# Patient Record
Sex: Female | Born: 1995 | Race: Black or African American | Hispanic: No | Marital: Single | Smoking: Never smoker
Health system: Southern US, Community
[De-identification: ages and names within clinical notes are randomized; demographics above are authoritative.]

---

## 2018-08-11 ENCOUNTER — Ambulatory Visit (HOSPITAL_COMMUNITY)
Admission: EM | Admit: 2018-08-11 | Discharge: 2018-08-11 | Disposition: A | Discharge status: Home / Self Care | Payer: BC Managed Care – PPO | Source: Home / Self Care

## 2018-08-11 ENCOUNTER — Emergency Department (HOSPITAL_COMMUNITY)
Admission: EM | Admit: 2018-08-11 | Discharge: 2018-08-12 | Disposition: A | Discharge status: Home / Self Care | Payer: BC Managed Care – PPO | Attending: Emergency Medicine | Admitting: Emergency Medicine

## 2018-08-11 ENCOUNTER — Encounter (HOSPITAL_COMMUNITY): Payer: Self-pay

## 2018-08-11 DIAGNOSIS — G44319 Acute post-traumatic headache, not intractable: Secondary | ICD-10-CM

## 2018-08-11 DIAGNOSIS — G44309 Post-traumatic headache, unspecified, not intractable: Secondary | ICD-10-CM | POA: Diagnosis not present

## 2018-08-11 DIAGNOSIS — R51 Headache: Secondary | ICD-10-CM | POA: Diagnosis present

## 2018-08-11 LAB — I-STAT BETA HCG BLOOD, ED (MC, WL, AP ONLY)

## 2018-08-11 LAB — BASIC METABOLIC PANEL
Anion gap: 8 (ref 5–15)
BUN: 9 mg/dL (ref 6–20)
CO2: 24 mmol/L (ref 22–32)
Calcium: 9 mg/dL (ref 8.9–10.3)
Chloride: 105 mmol/L (ref 98–111)
Creatinine, Ser: 0.59 mg/dL (ref 0.44–1.00)
Glucose, Bld: 86 mg/dL (ref 70–99)
Potassium: 4 mmol/L (ref 3.5–5.1)
SODIUM: 137 mmol/L (ref 135–145)

## 2018-08-11 LAB — CBC
HCT: 39.4 % (ref 36.0–46.0)
Hemoglobin: 12.1 g/dL (ref 12.0–15.0)
MCH: 27.9 pg (ref 26.0–34.0)
MCHC: 30.7 g/dL (ref 30.0–36.0)
MCV: 91 fL (ref 80.0–100.0)
PLATELETS: 211 10*3/uL (ref 150–400)
RBC: 4.33 MIL/uL (ref 3.87–5.11)
RDW: 12.2 % (ref 11.5–15.5)
WBC: 6.2 10*3/uL (ref 4.0–10.5)
nRBC: 0 % (ref 0.0–0.2)

## 2018-08-11 NOTE — ED Notes (Addendum)
Patient had a syncopal episode and fell.  Patient alert and oriented x 5.  Dr Delton See and Langston Masker, pa are agreeable to patient going to ed.  Patient is agreeable

## 2018-08-11 NOTE — ED Provider Notes (Signed)
[facility] EMERGENCY DEPARTMENT Provider Note   CSN: [id number] Arrival date & time: 08/11/18  1757   History   Chief Complaint Chief Complaint  Patient presents with  . Headache  . Loss of Consciousness    HPI Tina Henry is a 23 y.o. female.  HPI  Tina Henry is a 23 y.o. female with PMH of none who presents from urgent care with persistent headache after patient had syncopal event on Sunday.  Patient reports that she was sitting in her boyfriend's car in the front seat and had the heat on because it was cold outside.  She had not had much to eat or drink throughout the day and they just picked up food.  Open the door and stood up to walk inside and then rapidly felt her vision tunnel and then she saw spots, her hearing slowly went out, and then she lost consciousness.  Fell back and hit her head.  Thinks that she lost consciousness for about 1 to 2 seconds.  Was disoriented briefly after waking up to return to her baseline at about 30 seconds to a minute.  Able to ambulate without difficulty.  She has had a mild achy headache in her right posterior head that radiates around the right parietal scalp to the frontal head.  It has been nearly constant although does improve with ibuprofen at home.  Intermittently sharp.  No photophobia or phonophobia.  No vertigo and no recurrent lightheadedness.  She had no chest pain or palpitations and no dyspnea.  Never syncopized before.  No abdominal pain.  Eating and drinking normally.  Went to urgent care and they referred her to the ED.  No difficulty walking and denies numbness/weakness/tingling and paresthesias.  History reviewed. No pertinent past medical history.  There are no active problems to display for this patient.   History reviewed. No pertinent surgical history.   OB History   No obstetric history on file.      Home Medications    Prior to Admission medications   Not on File    Family History History  reviewed. No pertinent family history.  Social History Social History   Tobacco Use  . Smoking status: Never Smoker  . Smokeless tobacco: Never Used  Substance Use Topics  . Alcohol use: Yes    Comment: socially  . Drug use: Not on file     Allergies   Patient has no known allergies.   Review of Systems Review of Systems  Constitutional: Negative for chills and fever.  HENT: Negative for ear pain and sore throat.   Eyes: Negative for pain and visual disturbance.  Respiratory: Negative for cough and shortness of breath.   Cardiovascular: Negative for palpitations.  Gastrointestinal: Negative for abdominal pain and vomiting.  Genitourinary: Negative for dysuria and hematuria.  Musculoskeletal: Negative for arthralgias and back pain.  Skin: Negative for color change and rash.  Neurological: Positive for syncope and headaches. Negative for dizziness, seizures, facial asymmetry, weakness and light-headedness.  All other systems reviewed and are negative.    Physical Exam Updated Vital Signs BP 109/68   Pulse 80   Temp 98.5 F (36.9 C) (Oral)   Resp 18   SpO2 100%   Physical Exam Vitals signs and nursing note reviewed.  Constitutional:      General: She is not in acute distress.    Appearance: Normal appearance. She is well-developed. She is not ill-appearing.  HENT:     Head: Normocephalic and atraumatic.  No raccoon eyes, Battle's sign, abrasion, contusion, right periorbital erythema, left periorbital erythema or laceration.     Jaw: There is normal jaw occlusion.     Right Ear: Hearing, tympanic membrane, ear canal and external ear normal. No hemotympanum.     Left Ear: Hearing, tympanic membrane, ear canal and external ear normal. No hemotympanum.     Nose: Nose normal.     Mouth/Throat:     Lips: Pink.     Mouth: Mucous membranes are moist.  Eyes:     Conjunctiva/sclera: Conjunctivae normal.  Neck:     Musculoskeletal: Full passive range of motion without  pain and neck supple.  Cardiovascular:     Rate and Rhythm: Normal rate and regular rhythm.     Heart sounds: No murmur.  Pulmonary:     Effort: Pulmonary effort is normal. No respiratory distress.     Breath sounds: Normal breath sounds.  Abdominal:     Palpations: Abdomen is soft.     Tenderness: There is no abdominal tenderness.  Skin:    General: Skin is warm and dry.  Neurological:     General: No focal deficit present.     Mental Status: She is alert and oriented to person, place, and time.     GCS: GCS eye subscore is 4. GCS verbal subscore is 5. GCS motor subscore is 6.     Cranial Nerves: Cranial nerves are intact.     Sensory: Sensation is intact.     Motor: Motor function is intact.     Coordination: Coordination is intact.     Gait: Gait is intact.  Psychiatric:        Behavior: Behavior is cooperative.      ED Treatments / Results  Labs (all labs ordered are listed, but only abnormal results are displayed) Labs Reviewed  BASIC METABOLIC PANEL  CBC  I-STAT BETA HCG BLOOD, ED ([facility], [facility], [facility] ONLY)    EKG None  Radiology No results found.  Procedures Procedures (including critical care time)  Medications Ordered in ED Medications - No data to display   Initial Impression / Assessment and Plan / ED Course  I have reviewed the triage vital signs and the nursing notes.  Pertinent labs & imaging results that were available during my care of the patient were reviewed by me and considered in my medical decision making (see chart for details).     MDM:  Imaging: None indicated at this time.    ED Provider Interpretation of EKG: Sinus rhythm mother of 78 bpm, normal axis, minimal ST segment elevation most consistent with early repolarization.  No depression, pathologic T wave changes or significant interval regularity.  No bundle-branch blocks.  No delta wave.  No evidence for Brugada syndrome.  Labs: CBC and BMP normal.  Beta hCG negative.  On initial  evaluation, patient appears well. Afebrile and hemodynamically stable. Alert and oriented x4, pleasant, and cooperative.  Presents after syncope 5 days ago with headache as detailed above.  On exam, patient has no focal abnormality.  Her head and neck exam is normal.  No evidence for basilar skull fracture.  No tenderness to palpation about the area that she subjectively has the worst pain.  No palpable skull fracture or hematoma.  Full range of motion of her neck without pain.  No neurologic deficit and pulses are equal bilaterally.  Able to ambulate without difficulty.  No dysmetria or ataxia.  Patient is [place] CT head rule negative.  [place] CT C-spine negative.  No indication for imaging at this time.  Patient was offered analgesia and declined.  EKG shows evidence for acute ischemia or arrhythmia.  Her symptoms are consistent with vasovagal type syncope and she had no chest pain or palpitations.  No indication for troponin at this time.  Patient was counseled extensively on her signs and symptoms and reassuring exam, however, she was referred to the ED for possible CT head.  Showed patient the [place] CT head rule and discussed her symptoms.  Offered CT scan if she would like to undergo CT head but did discuss risks and benefits of small amount of radiation exposure.  In shared decision making, patient declined CT head.  Feel that this is appropriate.    Counseled on follow-up with her PCP as well as sports medicine with information on postconcussive syndrome symptoms.  Counseled that she may use Tylenol and Motrin as needed for pain per over-the-counter dosing as she had significant improvement with that prior to hospital arrival.  Discharged in stable condition with PCP follow-up and return precautions.  The plan for this patient was discussed with Dr. Rhunette Croft who voiced agreement and who oversaw evaluation and treatment of this patient.   The patient was fully informed and involved with the  history taking, evaluation, workup including labs/images, and plan. The patient's concerns and questions were addressed to the patient's satisfaction and she expressed agreement with the plan to DC home.    Final Clinical Impressions(s) / ED Diagnoses   Final diagnoses:  Acute post-traumatic headache, not intractable  Post-concussion headache    ED Discharge Orders    None       Remi Lopata, Sherryle Lis, MD 08/11/18 [id number]    Derwood Kaplan, MD 08/12/18 1601

## 2018-08-11 NOTE — ED Triage Notes (Signed)
Pt arrives POV for eval of headache after syncopal episode on Sunday and striking head. Was not seen for syncope, states she hadnt eaten all day and the car was very warm. Pt reports persistent HA ongoing today and she "just wants to be checked to be sure"

## 2020-05-24 ENCOUNTER — Encounter (HOSPITAL_COMMUNITY): Payer: Self-pay | Admitting: Emergency Medicine

## 2020-05-24 ENCOUNTER — Other Ambulatory Visit: Payer: Self-pay

## 2020-05-24 ENCOUNTER — Emergency Department (HOSPITAL_COMMUNITY)
Admission: EM | Admit: 2020-05-24 | Discharge: 2020-05-24 | Disposition: A | Discharge status: Home / Self Care | Payer: BC Managed Care – PPO | Attending: Emergency Medicine | Admitting: Emergency Medicine

## 2020-05-24 ENCOUNTER — Emergency Department (HOSPITAL_COMMUNITY): Payer: BC Managed Care – PPO

## 2020-05-24 DIAGNOSIS — N730 Acute parametritis and pelvic cellulitis: Secondary | ICD-10-CM

## 2020-05-24 DIAGNOSIS — N739 Female pelvic inflammatory disease, unspecified: Secondary | ICD-10-CM | POA: Diagnosis not present

## 2020-05-24 DIAGNOSIS — R1032 Left lower quadrant pain: Secondary | ICD-10-CM | POA: Diagnosis present

## 2020-05-24 LAB — URINALYSIS, ROUTINE W REFLEX MICROSCOPIC
Bilirubin Urine: NEGATIVE
Glucose, UA: NEGATIVE mg/dL
Hgb urine dipstick: NEGATIVE
Ketones, ur: 20 mg/dL — AB
Leukocytes,Ua: NEGATIVE
Nitrite: NEGATIVE
Protein, ur: NEGATIVE mg/dL
Specific Gravity, Urine: 1.028 (ref 1.005–1.030)
pH: 5 (ref 5.0–8.0)

## 2020-05-24 LAB — I-STAT BETA HCG BLOOD, ED (MC, WL, AP ONLY): I-stat hCG, quantitative: <5 m[IU]/mL (ref <5)

## 2020-05-24 LAB — COMPREHENSIVE METABOLIC PANEL
ALT: 15 U/L (ref 0–44)
AST: 18 U/L (ref 15–41)
Albumin: 4.3 g/dL (ref 3.5–5.0)
Alkaline Phosphatase: 49 U/L (ref 38–126)
Anion gap: 10 (ref 5–15)
BUN: 9 mg/dL (ref 6–20)
CO2: 27 mmol/L (ref 22–32)
Calcium: 9.2 mg/dL (ref 8.9–10.3)
Chloride: 98 mmol/L (ref 98–111)
Creatinine, Ser: 0.6 mg/dL (ref 0.44–1.00)
GFR, Estimated: >60 mL/min (ref >60)
Glucose, Bld: 108 mg/dL — ABNORMAL HIGH (ref 70–99)
Potassium: 3.3 mmol/L — ABNORMAL LOW (ref 3.5–5.1)
Sodium: 135 mmol/L (ref 135–145)
Total Bilirubin: 0.3 mg/dL (ref 0.3–1.2)
Total Protein: 8.3 g/dL — ABNORMAL HIGH (ref 6.5–8.1)

## 2020-05-24 LAB — WET PREP, GENITAL
Sperm: NONE SEEN
Trich, Wet Prep: NONE SEEN
Yeast Wet Prep HPF POC: NONE SEEN

## 2020-05-24 LAB — CBC
HCT: 40.6 % (ref 36.0–46.0)
Hemoglobin: 13.1 g/dL (ref 12.0–15.0)
MCH: 29.4 pg (ref 26.0–34.0)
MCHC: 32.3 g/dL (ref 30.0–36.0)
MCV: 91 fL (ref 80.0–100.0)
Platelets: 265 10*3/uL (ref 150–400)
RBC: 4.46 MIL/uL (ref 3.87–5.11)
RDW: 12.5 % (ref 11.5–15.5)
WBC: 12.1 10*3/uL — ABNORMAL HIGH (ref 4.0–10.5)
nRBC: 0 % (ref 0.0–0.2)

## 2020-05-24 LAB — LIPASE, BLOOD: Lipase: 21 U/L (ref 11–51)

## 2020-05-24 MED ORDER — LIDOCAINE HCL 1 % IJ SOLN
INTRAMUSCULAR | Status: AC
  Administered 2020-05-24: 2.1 mL
  Filled 2020-05-24: qty 20

## 2020-05-24 MED ORDER — DOXYCYCLINE HYCLATE 100 MG PO CAPS: 100.0000 mg | ORAL_CAPSULE | Freq: Two times a day (BID) | ORAL | 0 refills | Status: AC

## 2020-05-24 MED ORDER — IOHEXOL 300 MG/ML  SOLN
100.0000 mL | Freq: Once | INTRAMUSCULAR | Status: AC | PRN
  Administered 2020-05-24: 80 mL via INTRAVENOUS

## 2020-05-24 MED ORDER — LIDOCAINE HCL (PF) 1 % IJ SOLN: 1.0000 mL | Freq: Once | INTRAMUSCULAR | Status: DC

## 2020-05-24 MED ORDER — CEFTRIAXONE SODIUM 1 G IJ SOLR
500.0000 mg | Freq: Once | INTRAMUSCULAR | Status: AC
  Administered 2020-05-24: 500 mg via INTRAMUSCULAR
  Filled 2020-05-24: qty 10

## 2020-05-24 MED ORDER — METRONIDAZOLE 500 MG PO TABS
2000.0000 mg | ORAL_TABLET | Freq: Once | ORAL | Status: AC
  Administered 2020-05-24: 2000 mg via ORAL
  Filled 2020-05-24: qty 4

## 2020-05-24 MED ORDER — LIDOCAINE HCL (PF) 1 % IJ SOLN: 2.0000 mL | Freq: Once | INTRAMUSCULAR | Status: DC

## 2020-05-24 MED ORDER — AZITHROMYCIN 250 MG PO TABS
1000.0000 mg | ORAL_TABLET | Freq: Once | ORAL | Status: AC
  Administered 2020-05-24: 1000 mg via ORAL
  Filled 2020-05-24: qty 4

## 2020-05-24 NOTE — Discharge Instructions (Addendum)
Clinically suspicious for pelvic infection known as pelvic inflammatory disease based on the fluid in the fallopian tubes on CT scan.  Take the antibiotic doxycycline as directed for the next 14 days.  Would expect improvement over the next 2 days.  He also received antibiotic Rocephin and Zithromax and Flagyl here tonight.  Return for any new or worse symptoms.  Also notify your sexual partner of concerns for pelvic infection which can be sexually transmitted disease.

## 2020-05-24 NOTE — ED Triage Notes (Signed)
Patient reports abdominal pain x1 week, endorses bloating and tightness. Denies N/V/D, SOB, CP. Denies changes in discharge, urination, or back pain.

## 2020-05-24 NOTE — ED Provider Notes (Signed)
[facility]-EMERGENCY DEPT Provider Note   CSN: [id number] Arrival date & time: 05/24/20  1411     History No chief complaint on file.   Tina Henry is a 24 y.o. female.  Patient with a complaint of lower quadrant abdominal pain more to the left for a week.  Some bloating and tightness.  Denies any nausea vomiting or diarrhea any fevers denies any shortness of breath chest pain denies any vaginal discharge urination or back pain.        History reviewed. No pertinent past medical history.  There are no problems to display for this patient.   History reviewed. No pertinent surgical history.   OB History   No obstetric history on file.     No family history on file.  Social History   Tobacco Use  . Smoking status: Never Smoker  . Smokeless tobacco: Never Used  Substance Use Topics  . Alcohol use: Yes    Comment: socially  . Drug use: Not on file    Home Medications Prior to Admission medications   Medication Sig Start Date End Date Taking? Authorizing Provider  VIENVA 0.1-20 MG-MCG tablet Take 1 tablet by mouth daily. 02/19/20  Yes [provider]  doxycycline (VIBRAMYCIN) 100 MG capsule Take 1 capsule (100 mg total) by mouth 2 (two) times daily for 14 days. 05/24/20 06/07/20  Vanetta Mulders, MD    Allergies    Patient has no known allergies.  Review of Systems   Review of Systems  Constitutional: Negative for chills and fever.  HENT: Negative for congestion, rhinorrhea and sore throat.   Eyes: Negative for visual disturbance.  Respiratory: Negative for cough and shortness of breath.   Cardiovascular: Negative for chest pain and leg swelling.  Gastrointestinal: Positive for abdominal pain. Negative for diarrhea, nausea and vomiting.  Genitourinary: Negative for dysuria.  Musculoskeletal: Negative for back pain and neck pain.  Skin: Negative for rash.  Neurological: Negative for dizziness, light-headedness and headaches.    Hematological: Does not bruise/bleed easily.  Psychiatric/Behavioral: Negative for confusion.    Physical Exam Updated Vital Signs BP (!) 122/91   Pulse 78   Temp 98.8 F (37.1 C) (Oral)   Resp 16   Ht 1.626 m (5\' 4" )   Wt 45.4 kg   LMP 05/03/2020   SpO2 100%   BMI 17.16 kg/m   Physical Exam Vitals and nursing note reviewed.  Constitutional:      General: She is not in acute distress.    Appearance: Normal appearance. She is well-developed.  HENT:     Head: Normocephalic and atraumatic.  Eyes:     Extraocular Movements: Extraocular movements intact.     Conjunctiva/sclera: Conjunctivae normal.     Pupils: Pupils are equal, round, and reactive to light.  Cardiovascular:     Rate and Rhythm: Normal rate and regular rhythm.     Heart sounds: No murmur heard.   Pulmonary:     Effort: Pulmonary effort is normal. No respiratory distress.     Breath sounds: Normal breath sounds.  Abdominal:     Palpations: Abdomen is soft.     Tenderness: There is abdominal tenderness.     Comments: Mild tenderness to palpation left lower quadrant.  No guarding.  Genitourinary:    General: Normal vulva.     Comments: Pelvic exam external genitalia normal.  There is a purulent vaginal discharge seems to be coming from the cervical os.  No significant cervical motion tenderness.  But some slight tenderness.  No uterine tenderness and some mild left adnexal tenderness. Musculoskeletal:     Cervical back: Normal range of motion and neck supple.  Skin:    General: Skin is warm and dry.     Capillary Refill: Capillary refill takes less than 2 seconds.  Neurological:     General: No focal deficit present.     Mental Status: She is alert and oriented to person, place, and time.     Cranial Nerves: No cranial nerve deficit.     Sensory: No sensory deficit.     Motor: No weakness.     ED Results / Procedures / Treatments   Labs (all labs ordered are listed, but only abnormal results are  displayed) Labs Reviewed  WET PREP, GENITAL - Abnormal; Notable for the following components:      Result Value   Clue Cells Wet Prep HPF POC PRESENT (*)    WBC, Wet Prep HPF POC MODERATE (*)    All other components within normal limits  COMPREHENSIVE METABOLIC PANEL - Abnormal; Notable for the following components:   Potassium 3.3 (*)    Glucose, Bld 108 (*)    Total Protein 8.3 (*)    All other components within normal limits  CBC - Abnormal; Notable for the following components:   WBC 12.1 (*)    All other components within normal limits  URINALYSIS, ROUTINE W REFLEX MICROSCOPIC - Abnormal; Notable for the following components:   Ketones, ur 20 (*)    All other components within normal limits  LIPASE, BLOOD  RPR  HIV ANTIBODY (ROUTINE TESTING W REFLEX)  I-STAT BETA HCG BLOOD, ED ([facility], [facility], [facility] ONLY)  GC/CHLAMYDIA PROBE AMP ([place]) NOT AT [facility]    EKG None  Radiology CT Abdomen Pelvis W Contrast  Result Date: 05/24/2020 CLINICAL DATA:  Left lower quadrant abdominal pain EXAM: CT ABDOMEN AND PELVIS WITH CONTRAST TECHNIQUE: Multidetector CT imaging of the abdomen and pelvis was performed using the standard protocol following bolus administration of intravenous contrast. CONTRAST:  44mL OMNIPAQUE IOHEXOL 300 MG/ML  SOLN COMPARISON:  None. FINDINGS: Lower chest: The visualized lung bases are clear. The visualized heart and pericardium are unremarkable. Hepatobiliary: 8 mm cyst within the right hepatic lobe. Probable tiny cyst within the medial left hepatic lobe. Liver otherwise unremarkable. No intra or extrahepatic biliary ductal dilation. Gallbladder unremarkable Pancreas: Unremarkable Spleen: Unremarkable Adrenals/Urinary Tract: Adrenal glands are unremarkable. Kidneys are normal, without renal calculi, focal lesion, or hydronephrosis. Bladder is unremarkable. Stomach/Bowel: The stomach, small bowel, and large bowel are unremarkable. Appendix normal. There is moderate high  attenuation fluid within the pelvis likely representing hemorrhagic or proteinaceous fluid. No free intraperitoneal gas. Vascular/Lymphatic: No significant vascular findings are present. No enlarged abdominal or pelvic lymph nodes. Reproductive: The uterus is unremarkable. A 2.7 cm benign appearing cystic lesion is seen within the left ovary likely representing a follicular cyst. There is hyperemia involving the adnexa tissues with suggestion of varicoid fluid-filled fallopian tubes, best appreciated on sagittal imaging. While nonspecific, these findings can be seen in the setting of pelvic inflammatory disease with associated bilateral hydrosalpinx or pyosalpinx. Other: Rectum unremarkable no lytic or blastic bone lesion is identified. A a 8 mm ovoid lesion is seen in the region of the urethra possibly representing a a Skene's duct cyst (periurethral duct cyst), less likely a urethral diverticulum. Musculoskeletal: No lytic or blastic bone lesion identified. IMPRESSION: Hyperemia and varicoid fluid-filled structures within the adnexa bilaterally suggesting bilateral  hydrosalpinx, possibly the setting of pelvic inflammatory disease. Moderate high attenuation free fluid is seen within the pelvis compatible with hemorrhagic or proteinaceous fluid. Correlation with clinical examination and possible dedicated transvaginal sonography may be helpful for further evaluation. Ovoid lesion in the region of the urethra in near the external urethral meatus most likely representing a Skene's duct cyst. Electronically Signed   By: Helyn Numbers MD   On: 05/24/2020 21:26    Procedures Procedures (including critical care time)  Medications Ordered in ED Medications  azithromycin (ZITHROMAX) tablet 1,000 mg (has no administration in time range)  cefTRIAXone (ROCEPHIN) injection 500 mg (has no administration in time range)  metroNIDAZOLE (FLAGYL) tablet 2,000 mg (has no administration in time range)  iohexol (OMNIPAQUE)  300 MG/ML solution 100 mL (80 mLs Intravenous Contrast Given 05/24/20 2059)    ED Course  I have reviewed the triage vital signs and the nursing notes.  Pertinent labs & imaging results that were available during my care of the patient were reviewed by me and considered in my medical decision making (see chart for details).    MDM Rules/Calculators/A&P                          Patient nontoxic no acute distress.  On pelvic exam purulent discharge certainly consistent with STD.  But no real significant cervical motion tenderness or uterine tenderness just some mild left adnexal tenderness.  CT scan raises concerns about bilateral fallopian tube fluid which would be suggestive of PID.  Patient's pregnancy test negative mild leukocytosis.  No other acute findings on the CT of the abdomen.  Based on the significant purulent vaginal discharge coming from the cervical os will treat as STD and PID.  Patient will return if no improvement over the next few days. Final Clinical Impression(s) / ED Diagnoses Final diagnoses:  PID (acute pelvic inflammatory disease)    Rx / DC Orders ED Discharge Orders         Ordered    doxycycline (VIBRAMYCIN) 100 MG capsule  2 times daily        05/24/20 2313           Vanetta Mulders, MD 05/24/20 2318

## 2020-05-24 NOTE — ED Notes (Signed)
D/c no concerns at this time. 

## 2020-05-25 LAB — HIV ANTIBODY (ROUTINE TESTING W REFLEX): HIV Screen 4th Generation wRfx: NONREACTIVE

## 2020-05-25 LAB — RPR: RPR Ser Ql: NONREACTIVE

## 2020-05-27 LAB — GC/CHLAMYDIA PROBE AMP (~~LOC~~) NOT AT ARMC
Chlamydia: POSITIVE — AB
Comment: NEGATIVE
Comment: NORMAL
Neisseria Gonorrhea: NEGATIVE

## 2021-06-30 IMAGING — CT CT ABD-PELV W/ CM
2 of 4 series · 14 of 46 positions shown, 16 images · IV contrast (omnipaque)
Comparison: None.

CLINICAL DATA: Left lower quadrant abdominal pain

EXAM:
CT ABDOMEN AND PELVIS WITH CONTRAST
TECHNIQUE: Multidetector CT imaging of the abdomen and pelvis was performed
using the standard protocol following bolus administration of
intravenous contrast.
CONTRAST:  80mL OMNIPAQUE IOHEXOL 300 MG/ML  SOLN

[Series 2: axial st · axial · 0.57mm/px · z∈[+1101,+1471]mm · 11 of 84 slices shown, 13 images]
[im 5/84  soft-tissue]
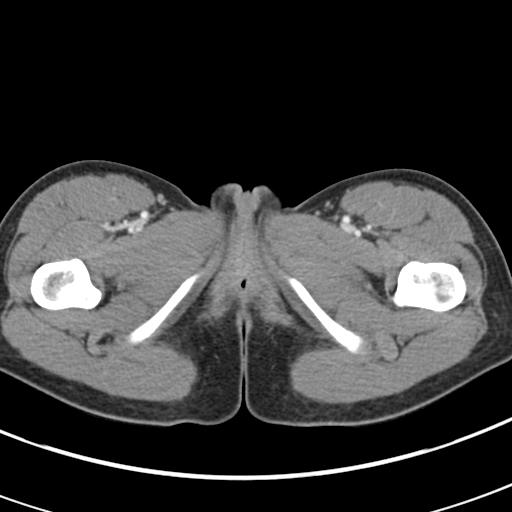
[im 5/84  bone]
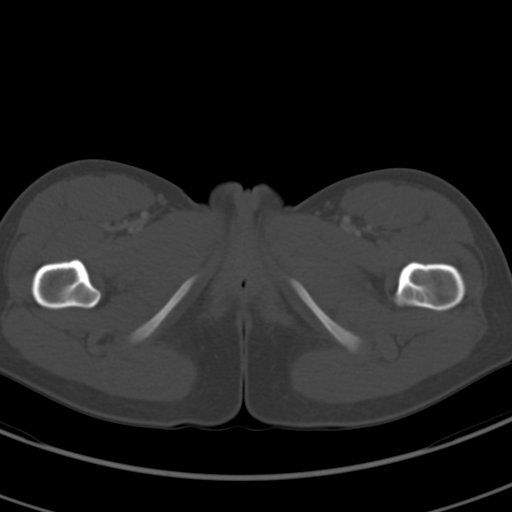
[im 13/84  soft-tissue]
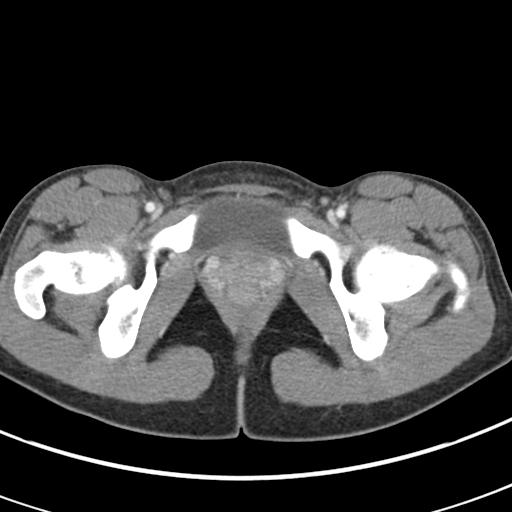
[im 21/84  soft-tissue]
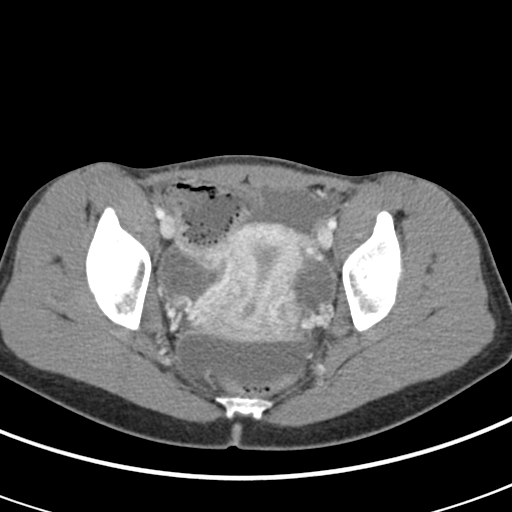
[im 30/84  soft-tissue]
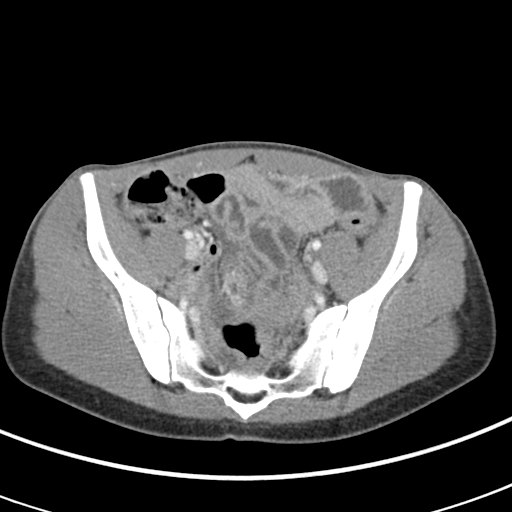
[im 34/84  soft-tissue]
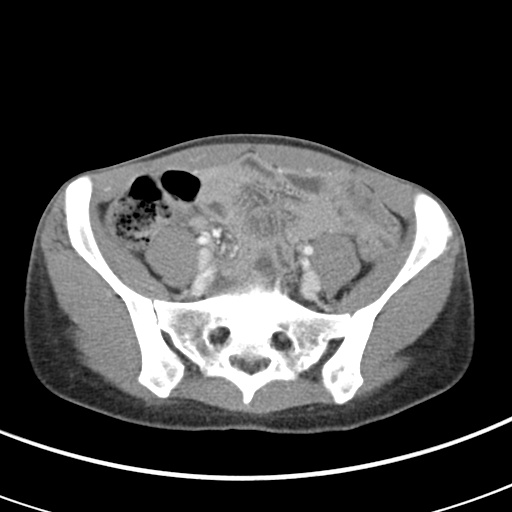
[im 42/84  soft-tissue]
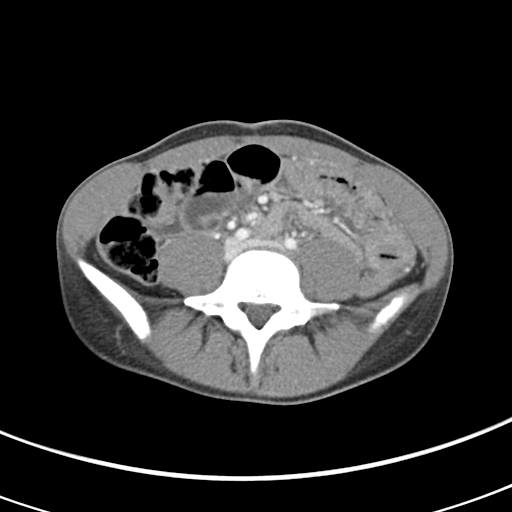
[im 50/84  soft-tissue]
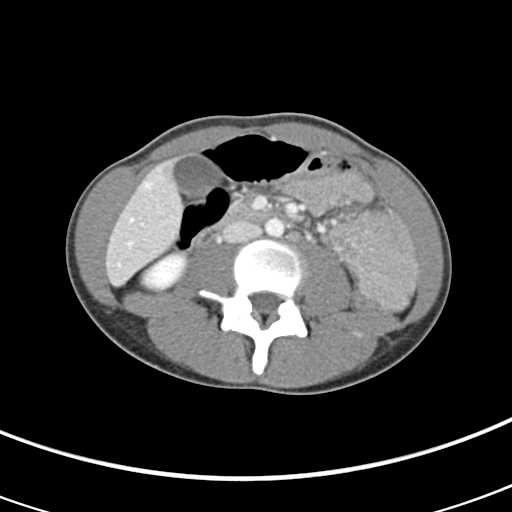
[im 54/84  soft-tissue]
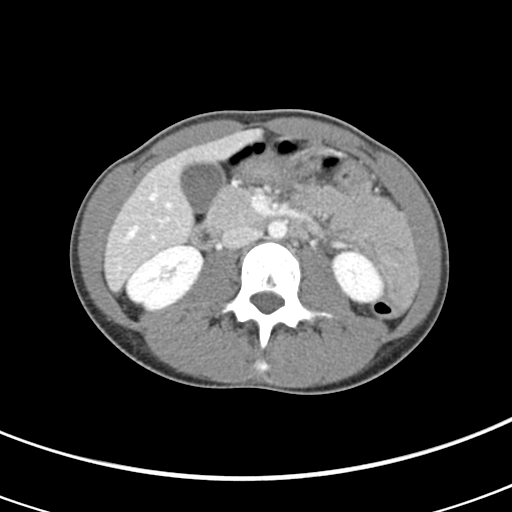
[im 63/84  soft-tissue]
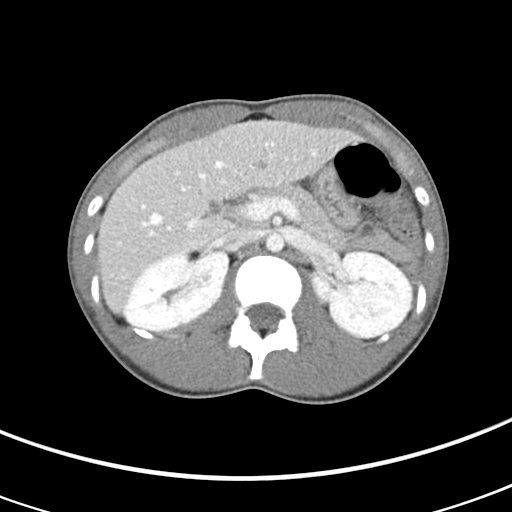
[im 63/84  bone]
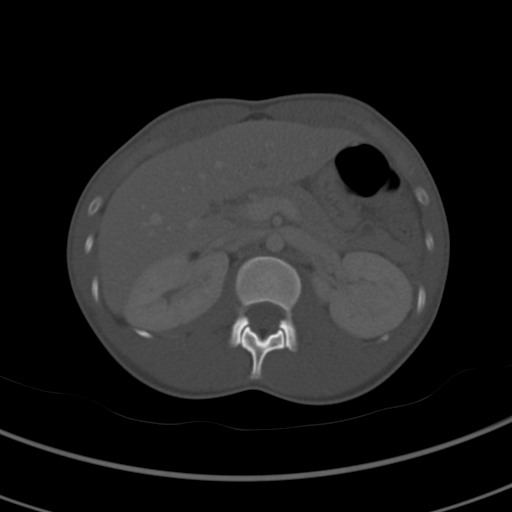
[im 71/84  soft-tissue]
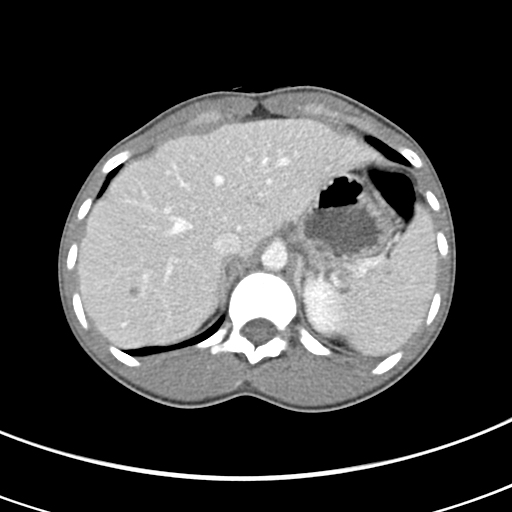
[im 79/84  soft-tissue]
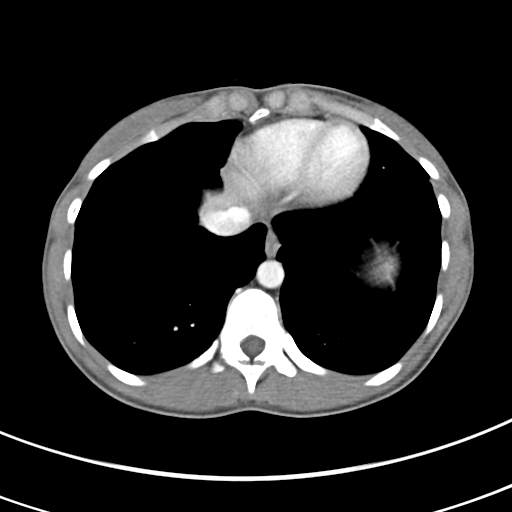

[Series 5: coronal st · coronal · 0.53mm/px · 3 of 107 slices shown]
[im 36/107  soft-tissue]
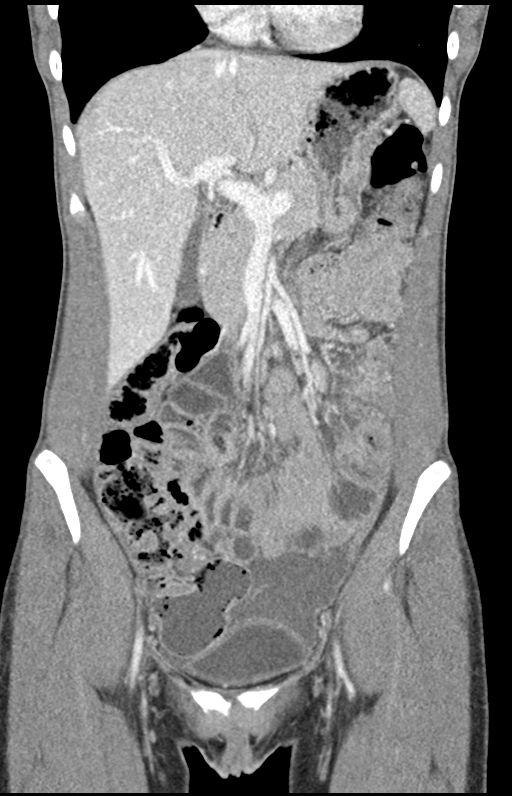
[im 48/107  soft-tissue]
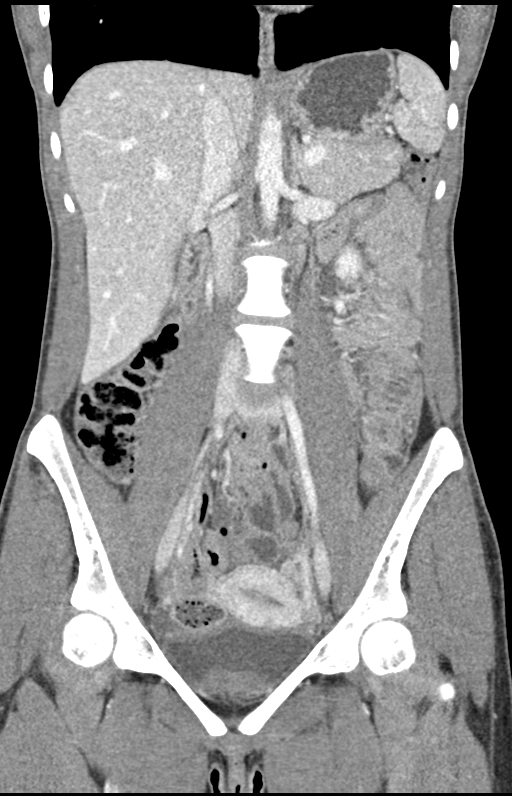
[im 59/107  soft-tissue]
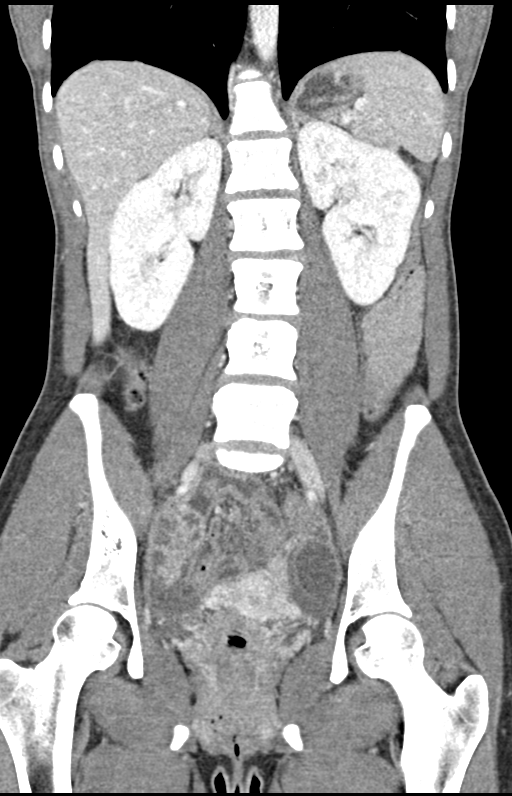

[14 of 46 positions shown; findings below may reference images not displayed]

FINDINGS: Lower chest: The visualized lung bases are clear. The visualized
heart and pericardium are unremarkable.

Hepatobiliary: 8 mm cyst within the right hepatic lobe. Probable
tiny cyst within the medial left hepatic lobe. Liver otherwise
unremarkable. No intra or extrahepatic biliary ductal dilation.
Gallbladder unremarkable

Pancreas: Unremarkable

Spleen: Unremarkable

Adrenals/Urinary Tract: Adrenal glands are unremarkable. Kidneys are
normal, without renal calculi, focal lesion, or hydronephrosis.
Bladder is unremarkable.

Stomach/Bowel: The stomach, small bowel, and large bowel are
unremarkable. Appendix normal. There is moderate high attenuation
fluid within the pelvis likely representing hemorrhagic or
proteinaceous fluid. No free intraperitoneal gas.

Vascular/Lymphatic: No significant vascular findings are present. No
enlarged abdominal or pelvic lymph nodes.

Reproductive: The uterus is unremarkable. A 2.7 cm benign appearing
cystic lesion is seen within the left ovary likely representing a
follicular cyst. There is hyperemia involving the adnexa tissues
with suggestion of varicoid fluid-filled fallopian tubes, best
appreciated on sagittal imaging. While nonspecific, these findings
can be seen in the setting of pelvic inflammatory disease with
associated bilateral hydrosalpinx or pyosalpinx.

Other: Rectum unremarkable no lytic or blastic bone lesion is
identified. A a 8 mm ovoid lesion is seen in the region of the
urethra possibly representing Roc Don cyst (periurethral
duct cyst), less likely a urethral diverticulum.

Musculoskeletal: No lytic or blastic bone lesion identified.
IMPRESSION: Hyperemia and varicoid fluid-filled structures within the adnexa
bilaterally suggesting bilateral hydrosalpinx, possibly the setting
of pelvic inflammatory disease. Moderate high attenuation free fluid
is seen within the pelvis compatible with hemorrhagic or
proteinaceous fluid. Correlation with clinical examination and
possible dedicated transvaginal sonography may be helpful for
further evaluation.

Ovoid lesion in the region of the urethra in near the external
urethral meatus most likely representing [place] Zinnirah cyst.
